# Patient Record
Sex: Female | Born: 1970 | Race: White | Hispanic: No | Marital: Married | Smoking: Current every day smoker
Health system: Southern US, Community
[De-identification: ages and names within clinical notes are randomized; demographics above are authoritative.]

## PROBLEM LIST (undated history)

## (undated) DIAGNOSIS — IMO0002 Reserved for concepts with insufficient information to code with codable children: Secondary | ICD-10-CM

## (undated) DIAGNOSIS — F419 Anxiety disorder, unspecified: Secondary | ICD-10-CM

## (undated) HISTORY — PX: BACK SURGERY: SHX140

---

## 2014-06-13 ENCOUNTER — Emergency Department (HOSPITAL_COMMUNITY)
Admission: EM | Admit: 2014-06-13 | Discharge: 2014-06-13 | Disposition: A | Discharge status: Home / Self Care | Payer: BC Managed Care – HMO | Attending: Emergency Medicine | Admitting: Emergency Medicine

## 2014-06-13 ENCOUNTER — Encounter (HOSPITAL_COMMUNITY): Payer: Self-pay | Admitting: Emergency Medicine

## 2014-06-13 ENCOUNTER — Emergency Department (HOSPITAL_COMMUNITY): Payer: BC Managed Care – HMO

## 2014-06-13 DIAGNOSIS — M549 Dorsalgia, unspecified: Secondary | ICD-10-CM | POA: Insufficient documentation

## 2014-06-13 DIAGNOSIS — R05 Cough: Secondary | ICD-10-CM | POA: Diagnosis present

## 2014-06-13 DIAGNOSIS — R609 Edema, unspecified: Secondary | ICD-10-CM | POA: Insufficient documentation

## 2014-06-13 DIAGNOSIS — Z9889 Other specified postprocedural states: Secondary | ICD-10-CM | POA: Insufficient documentation

## 2014-06-13 DIAGNOSIS — R059 Cough, unspecified: Secondary | ICD-10-CM | POA: Insufficient documentation

## 2014-06-13 DIAGNOSIS — R1110 Vomiting, unspecified: Secondary | ICD-10-CM | POA: Diagnosis not present

## 2014-06-13 DIAGNOSIS — J209 Acute bronchitis, unspecified: Secondary | ICD-10-CM | POA: Insufficient documentation

## 2014-06-13 DIAGNOSIS — R000 Tachycardia, unspecified: Secondary | ICD-10-CM | POA: Diagnosis not present

## 2014-06-13 DIAGNOSIS — Z8659 Personal history of other mental and behavioral disorders: Secondary | ICD-10-CM | POA: Insufficient documentation

## 2014-06-13 DIAGNOSIS — R600 Localized edema: Secondary | ICD-10-CM

## 2014-06-13 DIAGNOSIS — F17200 Nicotine dependence, unspecified, uncomplicated: Secondary | ICD-10-CM | POA: Diagnosis not present

## 2014-06-13 DIAGNOSIS — J40 Bronchitis, not specified as acute or chronic: Secondary | ICD-10-CM

## 2014-06-13 DIAGNOSIS — G8929 Other chronic pain: Secondary | ICD-10-CM | POA: Diagnosis not present

## 2014-06-13 HISTORY — DX: Reserved for concepts with insufficient information to code with codable children: IMO0002

## 2014-06-13 HISTORY — DX: Anxiety disorder, unspecified: F41.9

## 2014-06-13 LAB — BASIC METABOLIC PANEL
ANION GAP: 11 (ref 5–15)
BUN: 10 mg/dL (ref 6–23)
CALCIUM: 8.8 mg/dL (ref 8.4–10.5)
CO2: 25 mEq/L (ref 19–32)
CREATININE: 0.8 mg/dL (ref 0.50–1.10)
Chloride: 100 mEq/L (ref 96–112)
GFR calc non Af Amer: 90 mL/min — ABNORMAL LOW (ref >90)
Glucose, Bld: 99 mg/dL (ref 70–99)
Potassium: 4.1 mEq/L (ref 3.7–5.3)
Sodium: 136 mEq/L — ABNORMAL LOW (ref 137–147)

## 2014-06-13 LAB — TROPONIN I: Troponin I: <0.3 ng/mL (ref <0.30)

## 2014-06-13 LAB — CBC
HEMATOCRIT: 35.5 % — AB (ref 36.0–46.0)
Hemoglobin: 11.7 g/dL — ABNORMAL LOW (ref 12.0–15.0)
MCH: 31.4 pg (ref 26.0–34.0)
MCHC: 33 g/dL (ref 30.0–36.0)
MCV: 95.2 fL (ref 78.0–100.0)
PLATELETS: 298 10*3/uL (ref 150–400)
RBC: 3.73 MIL/uL — ABNORMAL LOW (ref 3.87–5.11)
RDW: 15.4 % (ref 11.5–15.5)
WBC: 8.3 10*3/uL (ref 4.0–10.5)

## 2014-06-13 LAB — D-DIMER, QUANTITATIVE: D-Dimer, Quant: 0.36 ug/mL-FEU (ref 0.00–0.48)

## 2014-06-13 LAB — PRO B NATRIURETIC PEPTIDE: Pro B Natriuretic peptide (BNP): 32.6 pg/mL (ref 0–125)

## 2014-06-13 MED ORDER — KETOROLAC TROMETHAMINE 60 MG/2ML IM SOLN
60.0000 mg | Freq: Once | INTRAMUSCULAR | Status: AC
  Administered 2014-06-13: 60 mg via INTRAMUSCULAR
  Filled 2014-06-13: qty 2

## 2014-06-13 MED ORDER — BENZONATATE 100 MG PO CAPS
200.0000 mg | ORAL_CAPSULE | Freq: Once | ORAL | Status: AC
  Administered 2014-06-13: 200 mg via ORAL
  Filled 2014-06-13: qty 2

## 2014-06-13 MED ORDER — BENZONATATE 100 MG PO CAPS: 100.0000 mg | ORAL_CAPSULE | Freq: Three times a day (TID) | ORAL | Status: AC

## 2014-06-13 MED ORDER — KETOROLAC TROMETHAMINE 30 MG/ML IJ SOLN: 30.0000 mg | Freq: Once | INTRAMUSCULAR | Status: DC

## 2014-06-13 MED ORDER — AZITHROMYCIN 250 MG PO TABS: 250.0000 mg | ORAL_TABLET | Freq: Every day | ORAL | Status: AC

## 2014-06-13 MED ORDER — CEFTRIAXONE SODIUM 1 G IJ SOLR
1.0000 g | Freq: Once | INTRAMUSCULAR | Status: AC
  Administered 2014-06-13: 1 g via INTRAMUSCULAR
  Filled 2014-06-13: qty 10

## 2014-06-13 MED ORDER — LIDOCAINE HCL (PF) 1 % IJ SOLN
2.0000 mL | Freq: Once | INTRAMUSCULAR | Status: AC
  Administered 2014-06-13: 2 mL
  Filled 2014-06-13: qty 5

## 2014-06-13 NOTE — ED Notes (Signed)
PA Erin at the bedside.  

## 2014-06-13 NOTE — ED Notes (Signed)
Patient returned from X-ray 

## 2014-06-13 NOTE — ED Provider Notes (Signed)
Medical screening examination/treatment/procedure(s) were performed by non-physician practitioner and as supervising physician I was immediately available for consultation/collaboration.   EKG Interpretation   Date/Time:  Sunday June 13 2014 19:35:39 EDT Ventricular Rate:  100 PR Interval:  120 QRS Duration: 88 QT Interval:  301 QTC Calculation: 388 R Axis:   36 Text Interpretation:  Sinus tachycardia No old tracing to compare  Confirmed by Cleona Doubleday  MD, Tynlee Bayle ([id number]) on 06/13/2014 9:25:40 PM        Rolan Bucco, MD 06/13/14 2238

## 2014-06-13 NOTE — ED Notes (Signed)
Pt to ED for evaluation of a dry cough for the past 3 days- denies CP.  Also reports swelling to bilateral lower legs.  Denies fever or chills.  No distress noted in triage.

## 2014-06-13 NOTE — ED Notes (Signed)
Pt ambulated to bathroom to change into paper scrubs. Steady gait. Pt's bed cleaned with new linen and chuck placed on bed. PT returned with personal clothes on.

## 2014-06-13 NOTE — ED Provider Notes (Signed)
CSN: [id number]     Arrival date & time 06/13/14  1748 History   First MD Initiated Contact with Patient 06/13/14 1846     Chief Complaint  Patient presents with  . Cough  . Foot Swelling     (Consider location/radiation/quality/duration/timing/severity/associated sxs/prior Treatment) HPI Pt is a 43yo female with hx of disc degeneration that required back surgery as well as hx of anxiety, presenting to ED with c/o 3 day hx of gradually worsening dry cough and bilateral leg swelling.  Pt states her 20yo son was just dx with bronchitis last week, at that time pt was given cough medication (promethazine-codeine) by her PCP for cough. Pt states she cannot sleep because the cough, cannot lay down. States she does feel Grahn fatigued though.  Pt is concerned as she states she does not feel sick but cannot get rid of the cough. States cough causes her to gag at times. Pt also concerned for leg swelling as she has not had that before. Denies known heart or lung problems. Denies fever or chills. Denies nausea or diarrhea.  Does report recent trip from [place] but no other recent travel.    Past Medical History  Diagnosis Date  . Disc degeneration   . Anxiety    Past Surgical History  Procedure Laterality Date  . Back surgery     No family history on file. History  Substance Use Topics  . Smoking status: Current Every Day Smoker  . Smokeless tobacco: Not on file  . Alcohol Use: No   OB History   Grav Para Term Preterm Abortions TAB SAB Ect Mult Living                 Review of Systems  Constitutional: Negative for fever and chills.  Respiratory: Positive for cough and shortness of breath.   Cardiovascular: Positive for chest pain ( with cough) and leg swelling ( bilateral). Negative for palpitations.  Gastrointestinal: Positive for vomiting ( post-tussive). Negative for nausea, abdominal pain, diarrhea and constipation.  Musculoskeletal: Positive for back pain ( chronic, worse with  cough). Negative for myalgias, neck pain and neck stiffness.  All other systems reviewed and are negative.     Allergies  Demerol  Home Medications   Prior to Admission medications   Medication Sig Start Date End Date Taking? Authorizing Provider  azithromycin (ZITHROMAX) 250 MG tablet Take 1 tablet (250 mg total) by mouth daily. Take first 2 tablets together, then 1 every day until finished. 06/13/14   Junius Finner, PA-C  benzonatate (TESSALON) 100 MG capsule Take 1 capsule (100 mg total) by mouth every 8 (eight) hours. 06/13/14   Junius Finner, PA-C   BP 112/58  Pulse 106  Temp(Src) 100 F (37.8 C) (Oral)  Resp 18  Ht  (1.549 m)  Wt 183 lb (83.008 kg)  BMI 34.60 kg/m2  SpO2 97%  LMP 05/15/2014 Physical Exam  Nursing note and vitals reviewed. Constitutional: She appears well-developed and well-nourished.  Pt sitting up in exam bed, appears teary eyed from coughing.   HENT:  Head: Normocephalic and atraumatic.  Eyes: Conjunctivae are normal. No scleral icterus.  Neck: Normal range of motion. Neck supple.  Cardiovascular: Normal rate, regular rhythm and normal heart sounds.   Pulmonary/Chest: Effort normal and breath sounds normal. No respiratory distress. She has no wheezes. She has no rales. She exhibits no tenderness.  Intermittent dry coughing spells during exam. Lungs: CTAB. No respiratory distress. Able to speak in full sentences. No  accessory muscle use.  Abdominal: Soft. Bowel sounds are normal. She exhibits no distension and no mass. There is no tenderness. There is no rebound and no guarding.  Musculoskeletal: Normal range of motion. She exhibits edema ( 1+ pedal edema bilaterally ).  Neurological: She is alert.  Skin: Skin is warm and dry.    ED Course  Procedures (including critical care time) Labs Review Labs Reviewed  CBC - Abnormal; Notable for the following:    RBC 3.73 (*)    Hemoglobin 11.7 (*)    HCT 35.5 (*)    All other components within  normal limits  BASIC METABOLIC PANEL - Abnormal; Notable for the following:    Sodium 136 (*)    GFR calc non Af Amer 90 (*)    All other components within normal limits  PRO B NATRIURETIC PEPTIDE  D-DIMER, QUANTITATIVE  TROPONIN I    Imaging Review Dg Chest 2 View  06/13/2014   CLINICAL DATA:  Cough and congestion for 3 days. Bilateral foot swelling.  EXAM: CHEST  2 VIEW  COMPARISON:  None.  FINDINGS: There are low lung volumes with patchy bibasilar atelectasis and mild central airway thickening. No focal airspace disease, edema or significant pleural effusion is seen. The heart size and mediastinal contours are normal. No osseous abnormalities are seen.  IMPRESSION: Low lung volumes with central airway thickening suggesting bronchitis. No evidence of pneumonia.   Electronically Signed   By: Roxy Horseman M.D.   On: 06/13/2014 19:31     EKG Interpretation None      MDM   Final diagnoses:  Bronchitis  Bilateral leg edema  Chronic back pain    Pt is a 43yo female recently dx with bronchitis by her PCP, was given a cough syrup for cough by states it has not been helping. Cough causes post-tussive vomiting. Pt also noticed bilateral leg edema. CP presenting with cough.  Pt is tachycardic in ED, possibly due to severe cough and/or back pain associated with cough.  Due to tachycardia, cannot r/o PE with PERC,  D-dimer orderd. Due to leg swelling BNP was also ordered.  Cardiac workup unremarkable. EKG-unremarkable. Troponin negative. BNP: WNL. D-dimer: negative CXR: significant for findings suggestive of bronchitis.  Due to severity of cough, will tx with rocephin and azithromycin.  Not concerned for emergent process taking place at this time including ACS, PE, or pneumothorax.  Advised to f/u with her PCP. Also prescribed tessalon pearls. Home care instructions provided. Return precautions provided. Pt verbalized understanding and agreement with tx plan.     Junius Finner,  PA-C 06/13/14 2125

## 2014-12-31 IMAGING — CR DG CHEST 2V
2 series · 2 of 2 positions shown · non-contrast
Comparison: None.

CLINICAL DATA: Cough and congestion for 3 days. Bilateral foot
swelling.

EXAM:
CHEST  2 VIEW

[w chest pa]
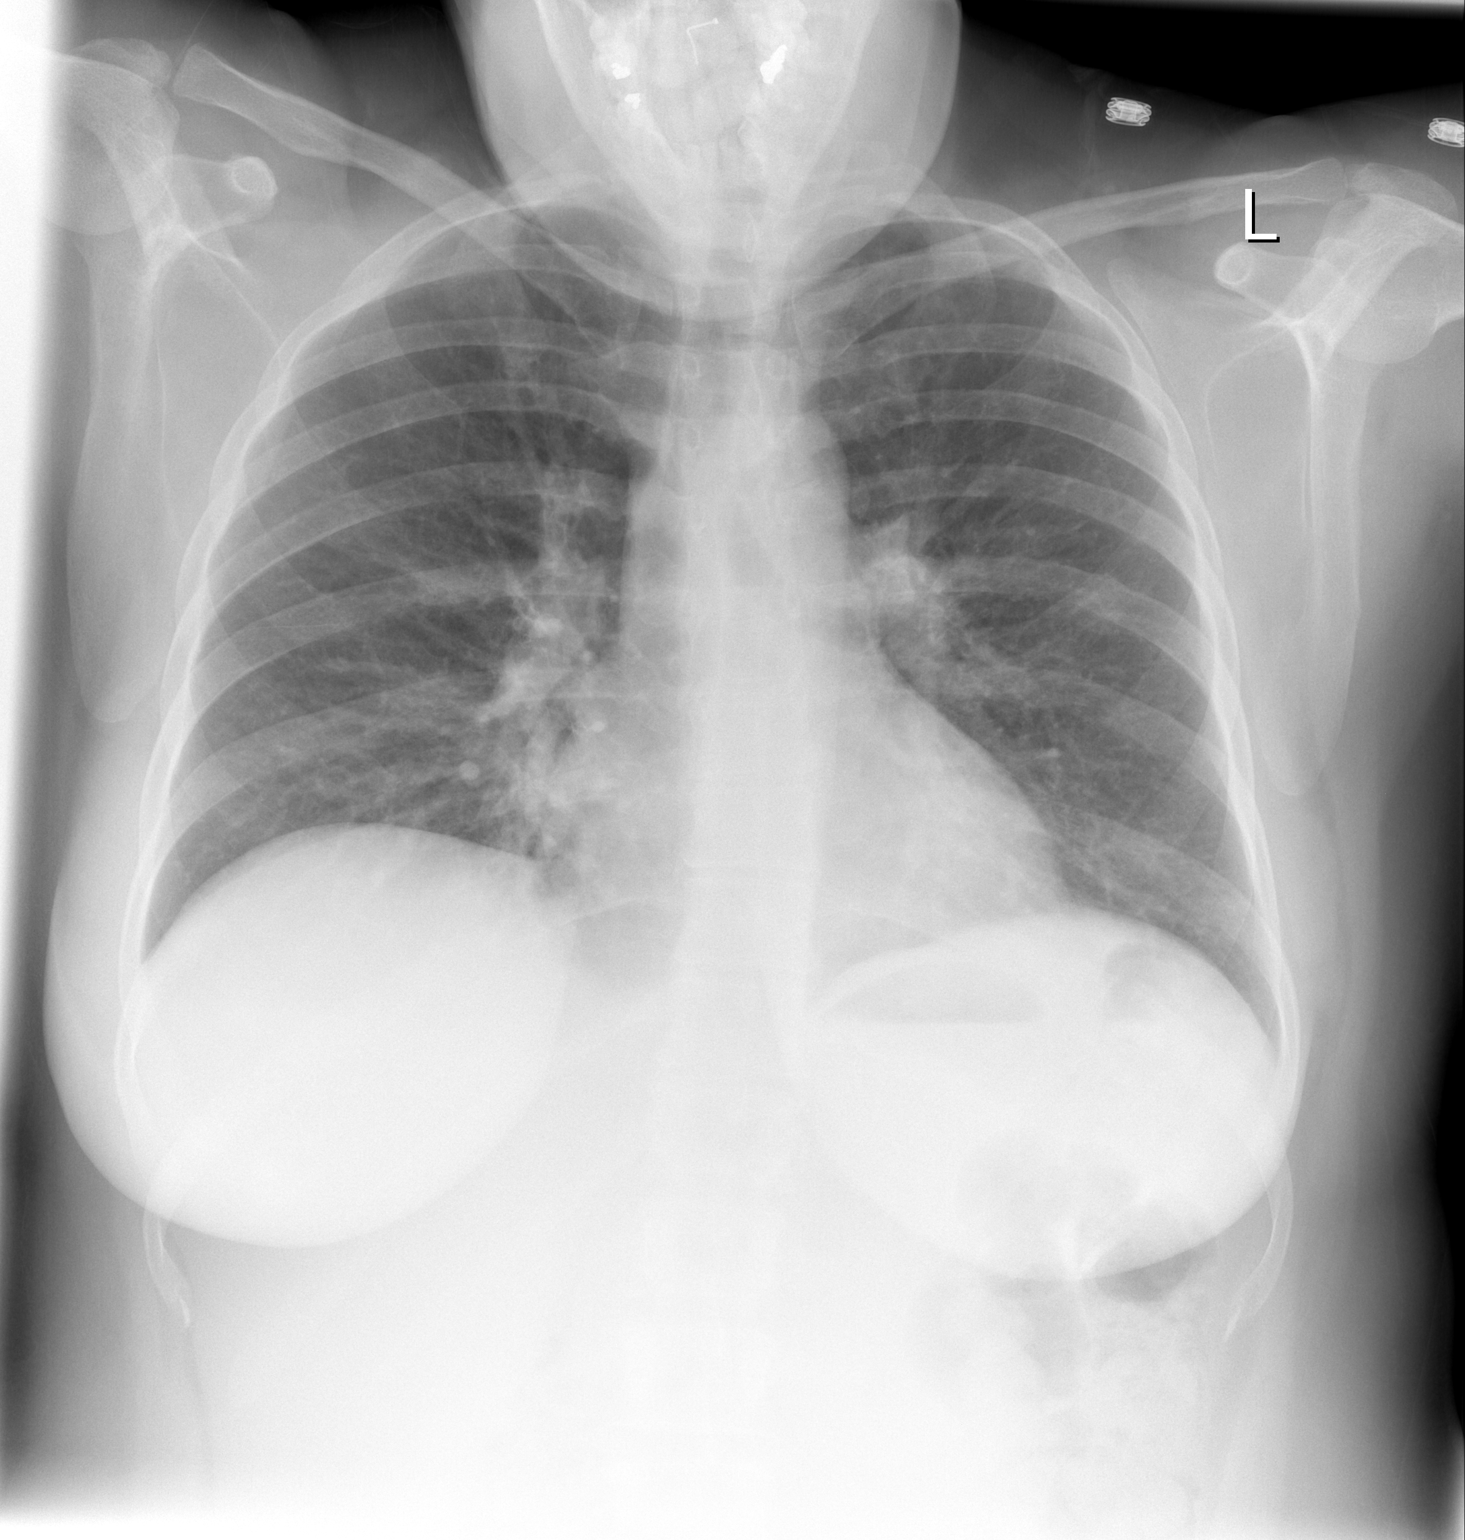

[w chest lat]
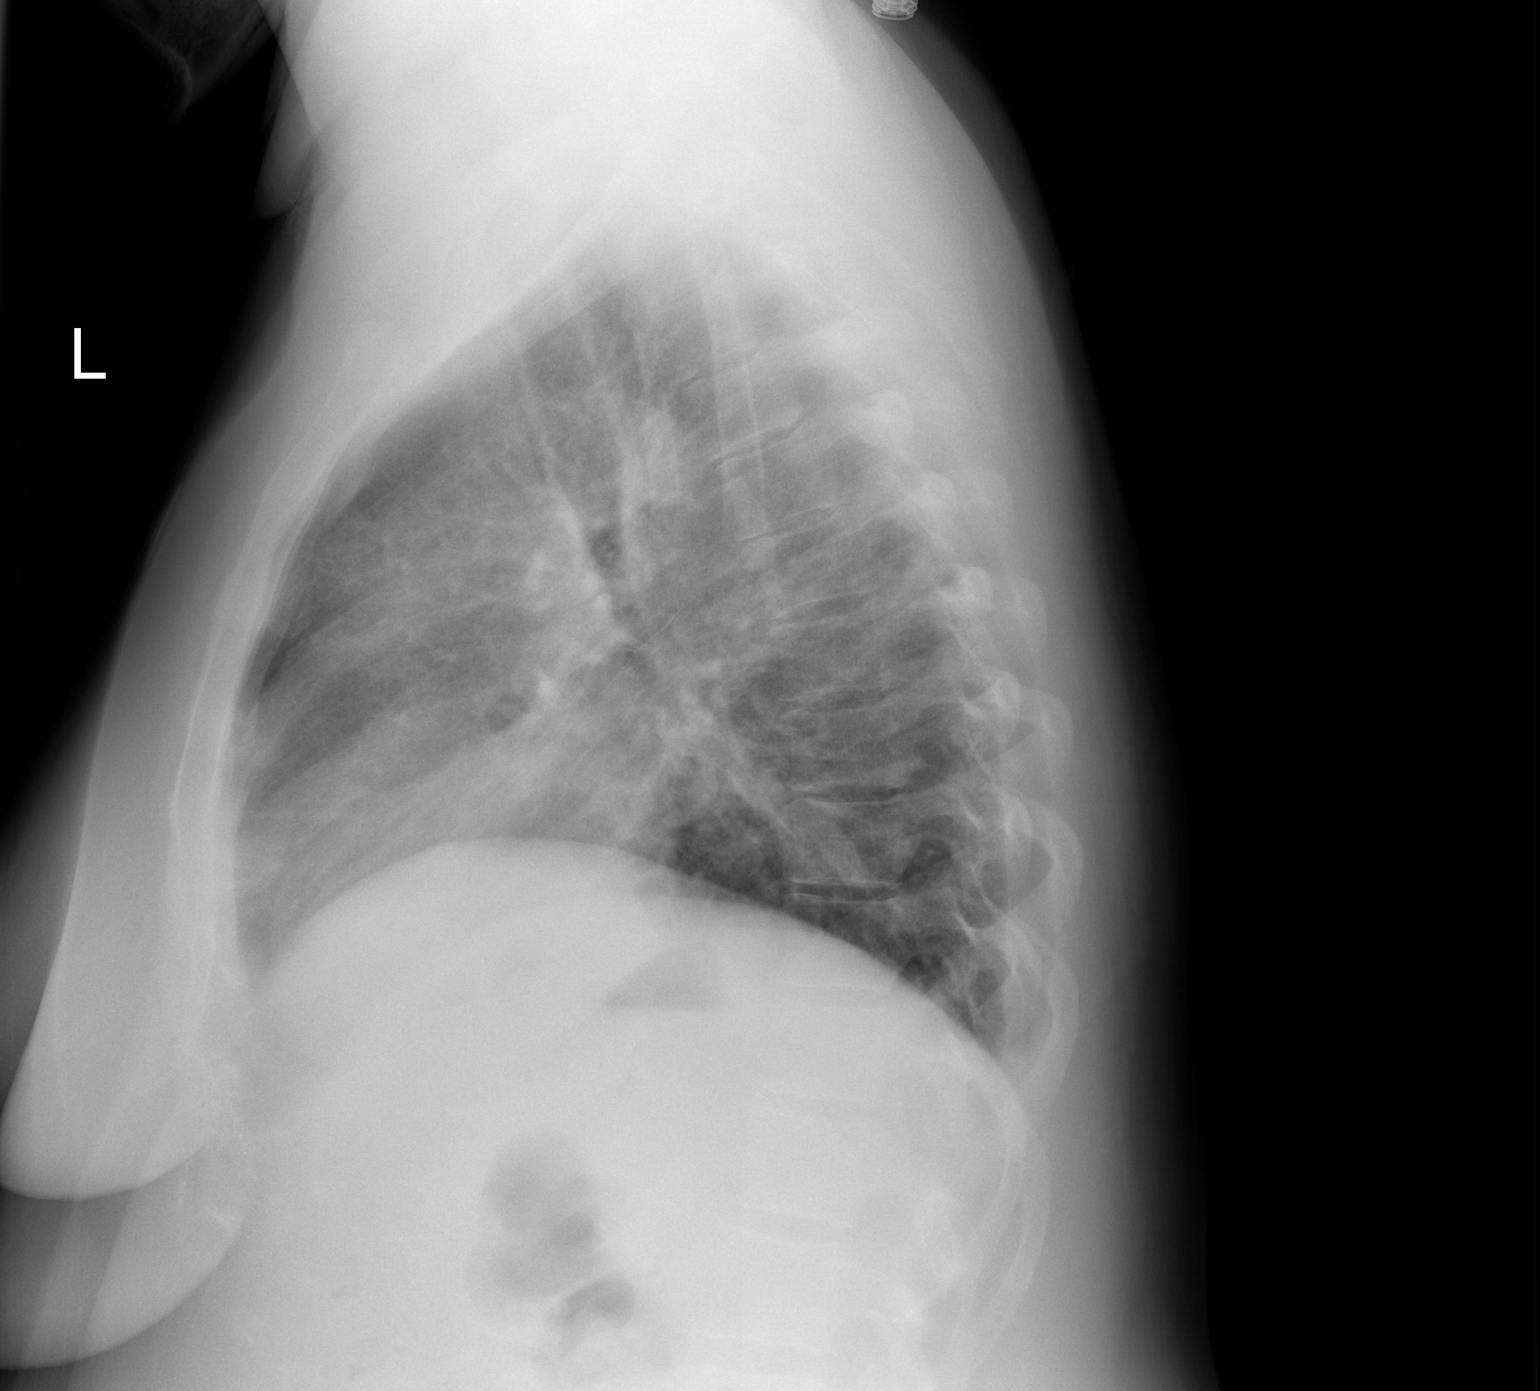

[2 of 2 positions shown; findings below may reference images not displayed]

FINDINGS: There are low lung volumes with patchy bibasilar atelectasis and
mild central airway thickening. No focal airspace disease, edema or
significant pleural effusion is seen. The heart size and mediastinal
contours are normal. No osseous abnormalities are seen.
IMPRESSION: Low lung volumes with central airway thickening suggesting
bronchitis. No evidence of pneumonia.
